# Patient Record
Sex: Female | Born: 1990 | Race: White | Hispanic: No | Marital: Single | Smoking: Never smoker
Health system: Southern US, Community
[De-identification: ages and names within clinical notes are randomized; demographics above are authoritative.]

## PROBLEM LIST (undated history)

## (undated) DIAGNOSIS — N83209 Unspecified ovarian cyst, unspecified side: Secondary | ICD-10-CM

## (undated) HISTORY — DX: Unspecified ovarian cyst, unspecified side: N83.209

---
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
## 2012-07-15 ENCOUNTER — Telehealth: Payer: Self-pay | Admitting: Certified Nurse Midwife

## 2012-07-15 NOTE — Telephone Encounter (Signed)
Spoke to patient she is complaining of a continuous bleed for 3 weeks the NuvaRing is not comfortable to her and is causing vaginal dryness. She wants to go on an OCP instead please advise.

## 2012-07-15 NOTE — Telephone Encounter (Signed)
See note

## 2012-07-15 NOTE — Telephone Encounter (Signed)
Patient notified of plan of action with NuvaRing.

## 2012-07-15 NOTE — Telephone Encounter (Signed)
She is in adjustment period should stop. Have her take out Nuvaring and finish week off and restart as usual .

## 2012-07-15 NOTE — Telephone Encounter (Signed)
Patient wants to speak with nurse re: switching birth control/[facility] [address] and [facility]

## 2012-08-19 ENCOUNTER — Other Ambulatory Visit: Payer: Self-pay

## 2012-08-19 ENCOUNTER — Telehealth: Payer: Self-pay | Admitting: Certified Nurse Midwife

## 2012-08-19 ENCOUNTER — Ambulatory Visit: Payer: Self-pay

## 2012-08-19 NOTE — Telephone Encounter (Signed)
Patient dnka gardasil appointment today. I left a message for patient to call a and reschedule.

## 2013-05-17 ENCOUNTER — Encounter: Payer: Self-pay | Admitting: Certified Nurse Midwife

## 2013-05-19 ENCOUNTER — Ambulatory Visit: Payer: Self-pay | Admitting: Certified Nurse Midwife

## 2013-05-25 ENCOUNTER — Encounter: Payer: Self-pay | Admitting: Certified Nurse Midwife

## 2013-05-25 ENCOUNTER — Telehealth: Payer: Self-pay | Admitting: Gynecology

## 2013-05-25 ENCOUNTER — Ambulatory Visit (INDEPENDENT_AMBULATORY_CARE_PROVIDER_SITE_OTHER): Payer: BC Managed Care – PPO | Admitting: Certified Nurse Midwife

## 2013-05-25 VITALS — BP 113/84 | HR 60 | Resp 16 | Ht 62.25 in | Wt 138.0 lb

## 2013-05-25 DIAGNOSIS — Z01419 Encounter for gynecological examination (general) (routine) without abnormal findings: Secondary | ICD-10-CM

## 2013-05-25 DIAGNOSIS — Z309 Encounter for contraceptive management, unspecified: Secondary | ICD-10-CM

## 2013-05-25 DIAGNOSIS — Z0000 Encounter for general adult medical examination without abnormal findings: Secondary | ICD-10-CM

## 2013-05-25 LAB — HEMOGLOBIN, FINGERSTICK: Hemoglobin, fingerstick: 13.4 g/dL (ref 12.0–16.0)

## 2013-05-25 MED ORDER — NORETHINDRONE ACET-ETHINYL EST 1-20 MG-MCG PO TABS: 1.0000 | ORAL_TABLET | Freq: Every day | ORAL | Status: DC

## 2013-05-25 NOTE — Telephone Encounter (Signed)
Note not needed 

## 2013-05-25 NOTE — Patient Instructions (Signed)

## 2013-05-25 NOTE — Progress Notes (Signed)
Reviewed personally.  M. Suzanne Darlis Wragg, MD.  

## 2013-05-25 NOTE — Progress Notes (Signed)
23 y.o. G0P0000 Single Caucasian Fe here for annual exam. Periods normal, occasional no period on, but consistent use. No health issues. No partner change or STD screening. Has new job as Production designer, theatre/television/film at [employer]  Patient's last menstrual period was 03/30/2013.          Sexually active: yes  The current method of family planning is OCP (estrogen/progesterone).    Exercising: yes  walking & running Smoker:  no  Health Maintenance: Pap:  2013 neg. MMG:  none Colonoscopy: none BMD:   none TDaP:  2011 Labs: Hgb-13.4 Self breast exam: not done   reports that she has never smoked. She does not have any smokeless tobacco history on file. She reports that she does not drink alcohol or use illicit drugs.  Past Medical History  Diagnosis Date  . Ovarian cyst     History reviewed. No pertinent past surgical history.  Current Outpatient Prescriptions  Medication Sig Dispense Refill  . MICROGESTIN 1-20 MG-MCG tablet daily.      . Multiple Vitamins-Minerals (MULTIVITAMIN PO) Take by mouth daily.       No current facility-administered medications for this visit.    Family History  Problem Relation Age of Onset  . Hypertension Maternal Grandfather     ROS:  Pertinent items are noted in HPI.  Otherwise, a comprehensive ROS was negative.  Exam:   BP 113/84  Pulse 60  Resp 16  Ht 5' 2.25" (1.581 m)  Wt 138 lb (62.596 kg)  BMI 25.04 kg/m2  LMP 03/30/2013 Height: 5' 2.25" (158.1 cm)  Ht Readings from Last 3 Encounters:  05/25/13 5' 2.25" (1.581 m)  02/12/09 5\' 4"  (1.626 m) (47%*, Z = -0.07)  10/23/08 5\' 4"  (1.626 m) (48%*, Z = -0.06)   * Growth percentiles are based on CDC 2-20 Years data.    General appearance: alert, cooperative and appears stated age Head: Normocephalic, without obvious abnormality, atraumatic Neck: no adenopathy, supple, symmetrical, trachea midline and thyroid normal to inspection and palpation and non-palpable Lungs: clear to auscultation bilaterally Breasts:  normal appearance, no masses or tenderness, No nipple retraction or dimpling, No nipple discharge or bleeding, No axillary or supraclavicular adenopathy Heart: regular rate and rhythm Abdomen: soft, non-tender; no masses,  no organomegaly Extremities: extremities normal, atraumatic, no cyanosis or edema Skin: Skin color, texture, turgor normal. No rashes or lesions Lymph nodes: Cervical, supraclavicular, and axillary nodes normal. No abnormal inguinal nodes palpated Neurologic: Grossly normal   Pelvic: External genitalia:  no lesions              Urethra:  normal appearing urethra with no masses, tenderness or lesions              Bartholin's and Skene's: normal                 Vagina: normal appearing vagina with normal color and discharge, no lesions              Cervix: normal,non tender              Pap taken: yes Bimanual Exam:  Uterus:  normal size, contour, position, consistency, mobility, non-tender and retroverted              Adnexa: normal adnexa and no mass, fullness, tenderness               Rectovaginal: Confirms               Anus:  normal sphincter tone, no  lesions  A:  Well Woman with normal exam  Contraception OCP desired  P:   Reviewed health and wellness pertinent to exam  Rx Microgestin see order  Pap smear as per guidelines   pap smear taken today with reflex  counseled on breast self exam, STD prevention, HIV risk factors and prevention, use and side effects of OCP's, adequate intake of calcium and vitamin D  return annually or prn  An After Visit Summary was printed and given to the patient.

## 2013-05-30 LAB — IPS PAP TEST WITH REFLEX TO HPV

## 2013-05-31 ENCOUNTER — Telehealth: Payer: Self-pay

## 2013-05-31 NOTE — Telephone Encounter (Signed)
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 In young women up to age 22 this may spontaneous resolve. It very important to be aware that HPV is present and a repeat pap smear needs to be done in one year for follow up to see if this has resolved. If she has not taken Gardasil or completed it is very important for her to do so. Patient can schedule OV if further questions. 08 ------

## 2013-05-31 NOTE — Telephone Encounter (Signed)
Patient notified of results. See lab 

## 2013-05-31 NOTE — Telephone Encounter (Signed)
lmtcb

## 2013-08-19 ENCOUNTER — Telehealth: Payer: Self-pay | Admitting: Certified Nurse Midwife

## 2013-08-19 DIAGNOSIS — Z309 Encounter for contraceptive management, unspecified: Secondary | ICD-10-CM

## 2013-08-19 MED ORDER — NORETHINDRONE ACET-ETHINYL EST 1-20 MG-MCG PO TABS: 1.0000 | ORAL_TABLET | Freq: Every day | ORAL | Status: DC

## 2013-08-19 NOTE — Telephone Encounter (Signed)
Spoke with patient. Patient states that she was seen in March by Verner Chol CNM. Was previously on birth control from [facility] and asked to continue with the same medication. After one month of taking patient state "My personality was changing and my hormones were ridiculous so I switched back to my old pack of birth control since I had a couple of packs left." Patient states that rx with [facility] was Microgestin 1-20 MG MCG. Advised patient that that is the same prescription we have on fill that we sent over at her [facility]. Patient states "They are not the same they are effecting me differently and my old package was white and now the one from your office is green." Patient's Rx from [facility] was called into [facility] off of [address] and [place]. Advised patient would contact pharmacy to verify rx and compare it to the rx we went over at [facility]. Advised if different would check with provider about the change and give patient a call back with further instructions and recommendations. Patient agreeable.

## 2013-08-19 NOTE — Telephone Encounter (Signed)
Spoke with [employer]. Patient has been taking Microgestin 1-20 21 without iron.

## 2013-08-19 NOTE — Telephone Encounter (Signed)
Pt has questions about her birth control.

## 2013-08-19 NOTE — Telephone Encounter (Signed)
Spoke with patient. Advised that active ingredients in her current prescription for Microgestin 1-20 mcg 21 day tablet have not been changed by Verner Chol CNM. She was ordered for same rx as she has been on prior. Advised that Pharmacies use different brands of pills and the generics may be substituted and she may be feeling side effects from the manufacturer of pill that [facility] is dispensing (called and confirmed with [facility] that they dispensed Norethindrone-ethinyl estradiol 1/20 mcg 21 tablets manufactured by [employer] which they state is A rated generic, states she last picked up her rx from [facility] 05/2013). Spoke with [facility] [phone] and confirmed they do have Microgestin and that they last dispensed this to her which was made by [employer]. I advised if she would like this brand only then she will need to transfer her rx back to the [place] and use that pharmacy. She is agreeable. Rx sent to [facility] per her request. She will call back if any side effects continue.   Routing to provider for final review. Patient agreeable to disposition. Will close encounter   cc Sara Chu.  Dr. Hyacinth Meeker covering.

## 2014-05-29 ENCOUNTER — Ambulatory Visit: Payer: BC Managed Care – PPO | Admitting: Certified Nurse Midwife

## 2014-05-29 ENCOUNTER — Encounter: Payer: Self-pay | Admitting: Certified Nurse Midwife

## 2014-05-29 ENCOUNTER — Telehealth: Payer: Self-pay | Admitting: Obstetrics & Gynecology

## 2014-05-29 NOTE — Telephone Encounter (Signed)
Patient dnka her aex appointment today with Sara Chuebbie Leonard.

## 2014-05-29 NOTE — Telephone Encounter (Signed)
[facility] and had abnormal pap smear and needs repeat this year

## 2014-06-05 NOTE — Telephone Encounter (Signed)
Left message for pt to call & schedule aex

## 2014-06-06 NOTE — Telephone Encounter (Signed)
Left message to callback to schedule aex 

## 2014-06-08 NOTE — Telephone Encounter (Signed)
Left message to callback to schedule aex 

## 2014-06-13 NOTE — Telephone Encounter (Signed)
Several attempts to contact patient with no callback or appt made. Pt needs a special pap letter mailed to her.

## 2014-06-14 NOTE — Telephone Encounter (Signed)
Do we have a special letter to send to a patient that needs repeat pap due to abnormal? This is an under 24 who needed repeat due to ASCUS Thank you

## 2014-06-21 ENCOUNTER — Encounter: Payer: Self-pay | Admitting: *Deleted

## 2014-06-21 NOTE — Telephone Encounter (Signed)
Pt has not returned call or scheduled [facility].  08 recall due in 05/2014.  05/25/13 Pap, ASCUS with pos HR HPV  Letter created.  Please advise recall.

## 2014-06-22 NOTE — Telephone Encounter (Signed)
Letter mailed and marked as sent.  Pt out of recall. Closing encounter. 

## 2014-06-22 NOTE — Telephone Encounter (Signed)
Letter signed.  Please mail.  Out of recall.   

## 2014-06-28 ENCOUNTER — Other Ambulatory Visit: Payer: Self-pay | Admitting: Certified Nurse Midwife

## 2014-06-28 DIAGNOSIS — Z3040 Encounter for surveillance of contraceptives, unspecified: Secondary | ICD-10-CM

## 2014-06-28 NOTE — Telephone Encounter (Signed)
Patient calling requesting refills on her birth control. Pharmacy on file is correct. [facility] 09/01/14.

## 2014-06-28 NOTE — Telephone Encounter (Signed)
Medication refill request: OCP Last AEX:  05/25/13 DL Next AEX: 1/61/096/10/16 Last MMG (if hormonal medication request): none Refill authorized: 08/19/13 #1pack/12R today #3packs/0R?  Routed to PG. DL Out of the office today

## 2014-06-30 MED ORDER — NORETHINDRONE ACET-ETHINYL EST 1-20 MG-MCG PO TABS: 1.0000 | ORAL_TABLET | Freq: Every day | ORAL | Status: DC

## 2014-09-01 ENCOUNTER — Encounter: Payer: Self-pay | Admitting: Certified Nurse Midwife

## 2014-09-01 ENCOUNTER — Ambulatory Visit (INDEPENDENT_AMBULATORY_CARE_PROVIDER_SITE_OTHER): Payer: BLUE CROSS/BLUE SHIELD | Admitting: Certified Nurse Midwife

## 2014-09-01 VITALS — BP 112/70 | HR 68 | Resp 16 | Ht 62.75 in | Wt 143.0 lb

## 2014-09-01 DIAGNOSIS — Z124 Encounter for screening for malignant neoplasm of cervix: Secondary | ICD-10-CM | POA: Diagnosis not present

## 2014-09-01 DIAGNOSIS — Z8742 Personal history of other diseases of the female genital tract: Secondary | ICD-10-CM

## 2014-09-01 DIAGNOSIS — Z30011 Encounter for initial prescription of contraceptive pills: Secondary | ICD-10-CM | POA: Diagnosis not present

## 2014-09-01 DIAGNOSIS — Z01419 Encounter for gynecological examination (general) (routine) without abnormal findings: Secondary | ICD-10-CM

## 2014-09-01 MED ORDER — NORETHIN ACE-ETH ESTRAD-FE 1-20 MG-MCG(24) PO TABS: 1.0000 | ORAL_TABLET | Freq: Every day | ORAL | Status: DC

## 2014-09-01 NOTE — Progress Notes (Signed)
24 y.o. G0P0000 Single  Caucasian Fe here for annual exam . Periods having spotting one week before onset of menses and then period lasts one week. Stopped OCP 4 months ago. Same partner for past 4 years, no STD concerns.  Patient would like to be back on contraception with OCP change. Questions regarding HPV related pap smear last year.  Sees urgent care if needed. No health issues today.  Patient's last menstrual period was 08/04/2014.          Sexually active: No  The current method of family planning is abstinence.    Exercising: Yes.    running Smoker:  no  Health Maintenance: Pap:  05-25-13 ASCUS HPV HR +, no colpo done due to age MMG:  none Colonoscopy:  none BMD:   none TDaP:  2011 Labs: none Self breast exam: aware of them but no official checks   reports that she has never smoked. She does not have any smokeless tobacco history on file. She reports that she does not drink alcohol or use illicit drugs.  Past Medical History  Diagnosis Date  . Ovarian cyst     History reviewed. No pertinent past surgical history.  Current Outpatient Prescriptions  Medication Sig Dispense Refill  . Multiple Vitamins-Minerals (MULTIVITAMIN PO) Take by mouth daily.     No current facility-administered medications for this visit.    Family History  Problem Relation Age of Onset  . Hypertension Maternal Grandfather     ROS:  Pertinent items are noted in HPI.  Otherwise, a comprehensive ROS was negative.  Exam:   BP 112/70 mmHg  Pulse 68  Resp 16  Ht 5' 2.75" (1.594 m)  Wt 143 lb (64.864 kg)  BMI 25.53 kg/m2  LMP 08/04/2014 Height: 5' 2.75" (159.4 cm) Ht Readings from Last 3 Encounters:  09/01/14 5' 2.75" (1.594 m)  05/25/13 5' 2.25" (1.581 m)  02/12/09  (1.626 m) (47 %*, Z = -0.08)   * Growth percentiles are based on CDC 2-20 Years data.    General appearance: alert, cooperative and appears stated age Head: Normocephalic, without obvious abnormality, atraumatic Neck: no  adenopathy, supple, symmetrical, trachea midline and thyroid normal to inspection and palpation Lungs: clear to auscultation bilaterally Breasts: normal appearance, no masses or tenderness, No nipple retraction or dimpling, No nipple discharge or bleeding, No axillary or supraclavicular adenopathy Heart: regular rate and rhythm Abdomen: soft, non-tender; no masses,  no organomegaly Extremities: extremities normal, atraumatic, no cyanosis or edema Skin: Skin color, texture, turgor normal. No rashes or lesions Lymph nodes: Cervical, supraclavicular, and axillary nodes normal. No abnormal inguinal nodes palpated Neurologic: Grossly normal   Pelvic: External genitalia:  no lesions              Urethra:  normal appearing urethra with no masses, tenderness or lesions              Bartholin's and Skene's: normal                 Vagina: normal appearing vagina with normal color and discharge, no lesions              Cervix: normal, non tender, no lesions              Pap taken: Yes.   scant blood noted Bimanual Exam:  Uterus:  normal size, contour, position, consistency, mobility, non-tender              Adnexa: normal adnexa and no mass,  fullness, tenderness               Rectovaginal: Confirms               Anus:  Normal appearance  Chaperone present: Yes  A:  Well Woman with normal exam  Contraception OCP desired for contraception and cycle control  History of ASCUS pap with HPVHR positive follow up pap today    P:   Reviewed health and wellness pertinent to exam  Discussed keeping menses calendar and if spotting continues after 3 months of OCP(adjustment period) may need evaluation for or change OCP. Patient agreeable and will advise.  Rx Loestrin 24 FE see order with instructions  Discussed HPV and abnormal pap smear relationship. Discussed sexual transmission and Gardasil protection relationship in protection against HPVHR. Questions addressed in length. Given information sheet on  also. Will repeat pap smear if same results will still monitor and repeat. If not will advise. Patient voiced understanding.  Pap smear with HPVHR   counseled on breast self exam, STD prevention, HIV risk factors and prevention, diet, exercise, calcium.  return annually or prn  10 minutes in face to face consult regarding HPV etiology and pap smear.

## 2014-09-01 NOTE — Patient Instructions (Signed)
General topics  Next pap or exam is  due in 1 year Take a Women's multivitamin Take 1200 mg. of calcium daily - prefer dietary If any concerns in interim to call back  Breast Self-Awareness Practicing breast self-awareness may pick up problems early, prevent significant medical complications, and possibly save your life. By practicing breast self-awareness, you can become familiar with how your breasts look and feel and if your breasts are changing. This allows you to notice changes early. It can also offer you some reassurance that your breast health is good. One way to learn what is normal for your breasts and whether your breasts are changing is to do a breast self-exam. If you find a lump or something that was not present in the past, it is best to contact your caregiver right away. Other findings that should be evaluated by your caregiver include nipple discharge, especially if it is bloody; skin changes or reddening; areas where the skin seems to be pulled in (retracted); or new lumps and bumps. Breast pain is seldom associated with cancer (malignancy), but should also be evaluated by a caregiver. BREAST SELF-EXAM The best time to examine your breasts is 5 7 days after your menstrual period is over.  ExitCare Patient Information 2013 ExitCare[employer].   Exercise to Stay Healthy Exercise helps you become and stay healthy. EXERCISE IDEAS AND TIPS Choose exercises that:  You enjoy.  Fit into your day. You do not need to exercise really hard to be healthy. You can do exercises at a slow or medium level and stay healthy. You can:  Stretch before and after working out.  Try yoga, Pilates, or tai chi.  Lift weights.  Walk fast, swim, jog, run, climb stairs, bicycle, dance, or rollerskate.  Take aerobic classes. Exercises that burn about 150 calories:  Running 1  miles in 15 minutes.  Playing volleyball for 45 to 60 minutes.  Washing and waxing a car for 45 to 60  minutes.  Playing touch football for 45 minutes.  Walking 1  miles in 35 minutes.  Pushing a stroller 1  miles in 30 minutes.  Playing basketball for 30 minutes.  Raking leaves for 30 minutes.  Bicycling 5 miles in 30 minutes.  Walking 2 miles in 30 minutes.  Dancing for 30 minutes.  Shoveling snow for 15 minutes.  Swimming laps for 20 minutes.  Walking up stairs for 15 minutes.  Bicycling 4 miles in 15 minutes.  Gardening for 30 to 45 minutes.  Jumping rope for 15 minutes.  Washing windows or floors for 45 to 60 minutes. Document Released: 04/12/2010 Document Revised: 06/02/2011 Document Reviewed: 04/12/2010 ExitCare Patient Information 2013 ExitCare[employer].   Other topics ( that may be useful information):    Sexually Transmitted Disease Sexually transmitted disease (STD) refers to any infection that is passed from person to person during sexual activity. This may happen by way of saliva, semen, blood, vaginal mucus, or urine. Common STDs include:  Gonorrhea.  Chlamydia.  Syphilis.  HIV/AIDS.  Genital herpes.  Hepatitis B and C.  Trichomonas.  Human papillomavirus (HPV).  Pubic lice. CAUSES  An STD may be spread by bacteria, virus, or parasite. A person can get an STD by:  Sexual intercourse with an infected person.  Sharing sex toys with an infected person.  Sharing needles with an infected person.  Having intimate contact with the genitals, mouth, or rectal areas of an infected person. SYMPTOMS  Some people may not have any symptoms, but   they can still pass the infection to others. Different STDs have different symptoms. Symptoms include:  Painful or bloody urination.  Pain in the pelvis, abdomen, vagina, anus, throat, or eyes.  Skin rash, itching, irritation, growths, or sores (lesions). These usually occur in the genital or anal area.  Abnormal vaginal discharge.  Penile discharge in men.  Soft, flesh-colored skin growths in the  genital or anal area.  Fever.  Pain or bleeding during sexual intercourse.  Swollen glands in the groin area.  Yellow skin and eyes (jaundice). This is seen with hepatitis. DIAGNOSIS  To make a diagnosis, your caregiver may:  Take a medical history.  Perform a physical exam.  Take a specimen (culture) to be examined.  Examine a sample of discharge under a microscope.  Perform blood test TREATMENT   Chlamydia, gonorrhea, trichomonas, and syphilis can be cured with antibiotic medicine.  Genital herpes, hepatitis, and HIV can be treated, but not cured, with prescribed medicines. The medicines will lessen the symptoms.  Genital warts from HPV can be treated with medicine or by freezing, burning (electrocautery), or surgery. Warts may come back.  HPV is a virus and cannot be cured with medicine or surgery.However, abnormal areas may be followed very closely by your caregiver and may be removed from the cervix, vagina, or vulva through office procedures or surgery. If your diagnosis is confirmed, your recent sexual partners need treatment. This is true even if they are symptom-free or have a negative culture or evaluation. They should not have sex until their caregiver says it is okay. HOME CARE INSTRUCTIONS  All sexual partners should be informed, tested, and treated for all STDs.  Take your antibiotics as directed. Finish them even if you start to feel better.  Only take over-the-counter or prescription medicines for pain, discomfort, or fever as directed by your caregiver.  Rest.  Eat a balanced diet and drink enough fluids to keep your urine clear or pale yellow.  Do not have sex until treatment is completed and you have followed up with your caregiver. STDs should be checked after treatment.  Keep all follow-up appointments, Pap tests, and blood tests as directed by your caregiver.  Only use latex condoms and water-soluble lubricants during sexual activity. Do not use  petroleum jelly or oils.  Avoid alcohol and illegal drugs.  Get vaccinated for HPV and hepatitis. If you have not received these vaccines in the past, talk to your caregiver about whether one or both might be right for you.  Avoid risky sex practices that can break the skin. The only way to avoid getting an STD is to avoid all sexual activity.Latex condoms and dental dams (for oral sex) will help lessen the risk of getting an STD, but will not completely eliminate the risk. SEEK MEDICAL CARE IF:   You have a fever.  You have any new or worsening symptoms. Document Released: 05/31/2002 Document Revised: 06/02/2011 Document Reviewed: 06/07/2010 [facility] Patient Information 2013 [place].    Domestic Abuse You are being battered or abused if someone close to you hits, pushes, or physically hurts you in any way. You also are being abused if you are forced into activities. You are being sexually abused if you are forced to have sexual contact of any kind. You are being emotionally abused if you are made to feel worthless or if you are constantly threatened. It is important to remember that help is available. No one has the right to abuse you. PREVENTION OF FURTHER  ABUSE  Learn the warning signs of danger. This varies with situations but may include: the use of alcohol, threats, isolation from friends and family, or forced sexual contact. Leave if you feel that violence is going to occur.  If you are attacked or beaten, report it to the police so the abuse is documented. You do not have to press charges. The police can protect you while you or the attackers are leaving. Get the officer's name and badge number and a copy of the report.  Find someone you can trust and tell them what is happening to you: your caregiver, a nurse, clergy member, close friend or family member. Feeling ashamed is natural, but remember that you have done nothing wrong. No one deserves abuse. Document Released:  03/07/2000 Document Revised: 06/02/2011 Document Reviewed: 05/16/2010 ExitCare Patient Information 2013 [employer]Care[employer].    How Much is Too Much Alcohol? Drinking too much alcohol can cause injury, accidents, and health problems. These types of problems can include:   Car crashes.  Falls.  Family fighting (domestic violence).  Drowning.  Fights.  Injuries.  Burns.  Damage to certain organs.  Having a baby with birth defects. ONE DRINK CAN BE TOO MUCH WHEN YOU ARE:  Working.  Pregnant or breastfeeding.  Taking medicines. Ask your doctor.  Driving or planning to drive. If you or someone you know has a drinking problem, get help from a doctor.  Document Released: 01/04/2009 Document Revised: 06/02/2011 Document Reviewed: 01/04/2009 ExitCare Patient Information 2013 ExitCare[employer].   Smoking Hazards Smoking cigarettes is extremely bad for your health. Tobacco smoke has over 200 known poisons in it. There are over 60 chemicals in tobacco smoke that cause cancer. Some of the chemicals found in cigarette smoke include:   Cyanide.  Benzene.  Formaldehyde.  Methanol (wood alcohol).  Acetylene (fuel used in welding torches).  Ammonia. Cigarette smoke also contains the poisonous gases nitrogen oxide and carbon monoxide.  Cigarette smokers have an increased risk of many serious medical problems and Smoking causes approximately:  90% of all lung cancer deaths in men.  80% of all lung cancer deaths in women.  90% of deaths from chronic obstructive lung disease. Compared with nonsmokers, smoking increases the risk of:  Coronary heart disease by 2 to 4 times.  Stroke by 2 to 4 times.  Men developing lung cancer by 23 times.  Women developing lung cancer by 13 times.  Dying from chronic obstructive lung diseases by 12 times.  . Smoking is the most preventable cause of death and disease in our society.  WHY IS SMOKING ADDICTIVE?  Nicotine is the chemical  agent in tobacco that is capable of causing addiction or dependence.  When you smoke and inhale, nicotine is absorbed rapidly into the bloodstream through your lungs. Nicotine absorbed through the lungs is capable of creating a powerful addiction. Both inhaled and non-inhaled nicotine may be addictive.  Addiction studies of cigarettes and spit tobacco show that addiction to nicotine occurs mainly during the teen years, when young people begin using tobacco products. WHAT ARE THE BENEFITS OF QUITTING?  There are many health benefits to quitting smoking.   Likelihood of developing cancer and heart disease decreases. Health improvements are seen almost immediately.  Blood pressure, pulse rate, and breathing patterns start returning to normal soon after quitting. QUITTING SMOKING   American Lung Association - 1-800-LUNGUSA  American Cancer Society - 1-800-ACS-2345 Document Released: 04/17/2004 Document Revised: 06/02/2011 Document Reviewed: 12/20/2008 ExitCare Patient Information 2013 ExitCare,   LLC.   Stress Management Stress is a state of physical or mental tension that often results from changes in your life or normal routine. Some common causes of stress are:  Death of a loved one.  Injuries or severe illnesses.  Getting fired or changing jobs.  Moving into a new home. Other causes may be:  Sexual problems.  Business or financial losses.  Taking on a large debt.  Regular conflict with someone at home or at work.  Constant tiredness from lack of sleep. It is not just bad things that are stressful. It may be stressful to:  Win the lottery.  Get married.  Buy a new car. The amount of stress that can be easily tolerated varies from person to person. Changes generally cause stress, regardless of the types of change. Too much stress can affect your health. It may lead to physical or emotional problems. Too little stress (boredom) may also become stressful. SUGGESTIONS TO  REDUCE STRESS:  Talk things over with your family and friends. It often is helpful to share your concerns and worries. If you feel your problem is serious, you may want to get help from a professional counselor.  Consider your problems one at a time instead of lumping them all together. Trying to take care of everything at once may seem impossible. List all the things you need to do and then start with the most important one. Set a goal to accomplish 2 or 3 things each day. If you expect to do too many in a single day you will naturally fail, causing you to feel even more stressed.  Do not use alcohol or drugs to relieve stress. Although you may feel better for a short time, they do not remove the problems that caused the stress. They can also be habit forming.  Exercise regularly - at least 3 times per week. Physical exercise can help to relieve that "uptight" feeling and will relax you.  The shortest distance between despair and hope is often a good night's sleep.  Go to bed and get up on time allowing yourself time for appointments without being rushed.  Take a short "time-out" period from any stressful situation that occurs during the day. Close your eyes and take some deep breaths. Starting with the muscles in your face, tense them, hold it for a few seconds, then relax. Repeat this with the muscles in your neck, shoulders, hand, stomach, back and legs.  Take good care of yourself. Eat a balanced diet and get plenty of rest.  Schedule time for having fun. Take a break from your daily routine to relax. HOME CARE INSTRUCTIONS   Call if you feel overwhelmed by your problems and feel you can no longer manage them on your own.  Return immediately if you feel like hurting yourself or someone else. Document Released: 09/03/2000 Document Revised: 06/02/2011 Document Reviewed: 04/26/2007 [facility] Patient Information 2013 [place].  Human Papillomavirus HPV stands for human  papillomavirus. There are many different types of HPV. People of all ages and races can get an HPV infection. In females, some types of HPV can cause warts on the sex organs or anus. Some females never see any warts on the outside, yet still have the infection inside. The doctor will need to check the sex organs and do a Pap test to see there is an HPV infection. The only sign of infection may be a Pap test that is abnormal. A female who has HPV has a higher  chance of getting cancer of the sex organs or anus. It is very rare, but some females can give their babies the HPV infection when the baby is being born. Some of these babies can grow warts on the voice box (vocal cords). Males with HPV have a higher chance of getting cancer of the penis or anus. A female may have HPV but not see any warts on his penis or anus. There is no test to find HPV in males, so even if warts are not seen, there may still be an infection. HOME CARE   Take medicines as told by your doctor.  Get needed Pap tests.  Keep follow-up exams.  Do not touch or scratch the warts.  Do not treat warts with medicines used for treating hand warts.  Tell your sex partner about your infection because he or she may also need treatment.  Do not have sex while you are getting treatment.  After treatment, use condoms during sex.  Use over-the-counter creams for itching as told by your doctor.  Use over-the-counter or prescription medicines for pain, discomfort or fever as told by your doctor.  Do not douche or use tampons during treatment of HPV. GET HELP RIGHT AWAY IF:  You have a temperature by mouth above 102 F (38.9 C), not controlled by medicine. MAKE SURE YOU:   Understand these instructions.  Will watch your condition.  Will get help right away if you are not doing well or get worse. Document Released: 02/21/2008 Document Revised: 06/02/2011 Document Reviewed: 06/15/2013 [facility] Patient Information 2015 [place],  [place]. This information is not intended to replace advice given to you by your health care provider. Make sure you discuss any questions you have with your health care provider.

## 2014-09-03 NOTE — Progress Notes (Signed)
Reviewed personally.  M. Suzanne Rea Kalama, MD.  

## 2014-09-05 LAB — IPS PAP TEST WITH HPV

## 2014-09-07 ENCOUNTER — Other Ambulatory Visit: Payer: Self-pay | Admitting: Certified Nurse Midwife

## 2014-09-07 DIAGNOSIS — R87619 Unspecified abnormal cytological findings in specimens from cervix uteri: Secondary | ICD-10-CM

## 2014-09-08 ENCOUNTER — Telehealth: Payer: Self-pay | Admitting: Emergency Medicine

## 2014-09-08 NOTE — Telephone Encounter (Signed)
Message left to return call to [place] at [phone].   Patient recently restarted ocp. LMP 08/04/14 per annual exam note.

## 2014-09-08 NOTE — Telephone Encounter (Signed)
Patient notified of message from Verner Chol CNM.  Patient given results. Patient tone escalated and patient expresses frustration with this result and recommendations for additional follow up. Patient asks questions about HPV and  and its relation to her receiving the HPV vaccine. Attempted to answer each question but patient interrupted with each response with further questions and did not allow explanation of results. Patient states "not that you care, anyways!"   Advised patient that consult with Dr. Hyacinth Meeker will be best as she can answer questions for patient prior to procedure and if patient chooses not to proceed at this that time, it is her choice. Patient agreed to schedule consult appointment with possible procedure.   Consult/colposcopy scheduled. LMP 09/01/14 and started Loestrin 24 FE with cycle.   Brief description of procedure given to patient and instructions given. Advised 800 mg of Motrin with food one hour prior to appointment , however, patient declines further information and interrupted with attempts to give instructions.      Advised will need to cancel or reschedule within 72 hours or will have $150.00 no show fee placed to account.   Patient verbalized understanding of preprocedure instructions and cancellation policy.  Patient is advised she will be contacted with insurance coverage information.           Cc Lilyan Gilford for insurance pre certification.   Routing to Dr. Hyacinth Meeker and Verner Chol CNM.   Will close encounter.

## 2014-09-08 NOTE — Telephone Encounter (Signed)
-----   Message from Verner Chol, CNM sent at 09/07/2014  7:29 AM EDT ----- Notify patient that her pap smear is abnormal with ASCUS + HPVHR and Atypical glandular cells . Needs colposcopy exam even though less than 23 years old due to atypical glandular cells. Order in  Schedule with Dr. Hyacinth Meeker

## 2014-09-11 ENCOUNTER — Telehealth: Payer: Self-pay | Admitting: Obstetrics & Gynecology

## 2014-09-11 NOTE — Telephone Encounter (Signed)
Called patient and she is agreeable to move appointment up to 1500. Appointment changed. Routing to provider for final review. Patient agreeable to disposition. Will close encounter.

## 2014-09-11 NOTE — Telephone Encounter (Signed)
Contacted pt with benefit for colposcopy scheduled for 09/12/14. Pt agreeable. Pt would like to check if there are any earlier appointments tomorrow. Pt requests call only if there is another option. If no other appointments, pt is ok with current time.

## 2014-09-12 ENCOUNTER — Ambulatory Visit: Payer: BLUE CROSS/BLUE SHIELD | Admitting: Obstetrics & Gynecology

## 2014-09-12 ENCOUNTER — Ambulatory Visit (INDEPENDENT_AMBULATORY_CARE_PROVIDER_SITE_OTHER): Payer: BLUE CROSS/BLUE SHIELD | Admitting: Obstetrics & Gynecology

## 2014-09-12 VITALS — BP 112/62 | HR 64 | Resp 12 | Wt 142.0 lb

## 2014-09-12 DIAGNOSIS — R896 Abnormal cytological findings in specimens from other organs, systems and tissues: Secondary | ICD-10-CM | POA: Diagnosis not present

## 2014-09-12 DIAGNOSIS — R87619 Unspecified abnormal cytological findings in specimens from cervix uteri: Secondary | ICD-10-CM

## 2014-09-12 DIAGNOSIS — N926 Irregular menstruation, unspecified: Secondary | ICD-10-CM

## 2014-09-12 DIAGNOSIS — R87610 Atypical squamous cells of undetermined significance on cytologic smear of cervix (ASC-US): Secondary | ICD-10-CM | POA: Diagnosis not present

## 2014-09-12 DIAGNOSIS — IMO0002 Reserved for concepts with insufficient information to code with codable children: Secondary | ICD-10-CM

## 2014-09-12 LAB — POCT URINE PREGNANCY: Preg Test, Ur: NEGATIVE

## 2014-09-12 NOTE — Progress Notes (Signed)
24 y.o. G0 Single Caucasian female here for colposcopy with possible biopsies and/or ECC due to ASUCS and AGUS pap with +HR HPV Pap obtained 09/01/14 with AEX with Leota Sauers.  Pt has many, many questions which were addressed individually.  She felt much better with explanation before proceeding with colposcopy.    Also discussed with pt issues/concerns she has with contraception.  Reports she was on Depo provera for most of 2013.  Just bled all the time saw switched to Nuva ring for a few months but this wasn't a great method for her as the ring often fell out.  Pt was then on Loestrin 24 which worked well for her.  She stopped the OCPs in December and now cycles have been every two weeks.  Reports she is bleeding now about every two weeks.  Flow typically seven days whether on or off OCPs.  Just restarted Loloestrin 24.  Started it on June 10th.    Patient's last menstrual period was 08/28/2014 (approximate).          Sexually active: No.  The current method of family planning is abstinence.     Patient has been counseled about results and procedure.  Risks and benefits have bene reviewed including immediate and/or delayed bleeding, infection, cervical scaring from procedure, possibility of needing additional follow up as well as treatment.  rare risks of missing a lesion discussed as well.  All questions answered.  Pt ready to proceed.  BP 112/62 mmHg  Pulse 64  Resp 12  Wt 142 lb (64.411 kg)  LMP 08/28/2014 (Approximate)  General appearance: alert, cooperative and appears stated age Head: Normocephalic, without obvious abnormality, atraumatic Neurologic: Grossly normal  Pelvic: External genitalia:  no lesions              Urethra:  normal appearing urethra with no masses, tenderness or lesions              Bartholins and Skenes: normal                 Vagina: normal appearing vagina with normal color and discharge, no lesions              Cervix: no lesions              Pap taken:  No.  Speculum placed.  3% acetic acid applied to cervix for >45 seconds.  Cervix visualized with both 7.5X and 15X magnification.  Green filter also used.  Lugols solution was used.  Findings:  Small area of AWE and decreased staining with Lugol's at 10-11 o'clock.  Biopsy:  At same location was obtained.  ECC:  was performed.  Monsel's was needed.  Excellent hemostasis was present.  Pt tolerated procedure well and all instruments were removed.  Findings noted above on picture of cervix.  Assessment:  ASCUS pap with +HR HPV, AGUS pap DUB off OCPs, recently restarted Non-smoker  Plan:  Pathology results will be called to patient and follow-up planned pending results.  Will plan repeat Pap in 6 months if everything appears appropriate with pathology results.  Advised to let us know next month if she is still bleeding irregularly for possible change in OCP vs  IUD use.  Information provided.

## 2014-09-15 LAB — IPS OTHER TISSUE BIOPSY

## 2014-09-17 ENCOUNTER — Encounter: Payer: Self-pay | Admitting: Obstetrics & Gynecology

## 2014-09-19 ENCOUNTER — Telehealth: Payer: Self-pay | Admitting: Emergency Medicine

## 2014-09-19 DIAGNOSIS — Z30430 Encounter for insertion of intrauterine contraceptive device: Secondary | ICD-10-CM

## 2014-09-19 DIAGNOSIS — N938 Other specified abnormal uterine and vaginal bleeding: Secondary | ICD-10-CM

## 2014-09-19 NOTE — Telephone Encounter (Signed)
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  ECC showed same finding and negative glandular tissue.  H/O ASCUS with +HR HPV and AGUS pap.  Needs repeat pap 6 months with me.  08 recall.

## 2014-09-19 NOTE — Telephone Encounter (Signed)
Patient states she spoke with Dr. Hyacinth MeekerMiller and discussed Mirena and to plan for placement under ultrasound guidance.  Patient states she is bleeding "most of the time." Pt unsure about dates of cycle. Wants to move forward with Mirena placement.     Can you advise okay to place at any time? Okay to place orders for precert? Currently on Loestrin 24 FE.     Spoke with patient and message from Dr. Hyacinth MeekerMiller given. Patient verbalized understanding of results and scheduled 6 Month Pap smear with Dr. Hyacinth MeekerMiller.   Advised would return call with plan regarding Mirena from Dr. Hyacinth MeekerMiller. Patient agreeable.  06 recall for pap in 6 months and 08 recall placed.

## 2014-09-20 NOTE — Telephone Encounter (Signed)
Orders signed.  Pt needs to abstain for two weeks and will need UPT day of visit.  As long as negative at that time, will proceed with placement.  Please schedule IUD insertion with PUS in two weeks from tomorrow.  Thanks.

## 2014-09-20 NOTE — Telephone Encounter (Signed)
Message left to return call to [place] at [phone].   Cytotec order pended

## 2014-09-28 MED ORDER — MISOPROSTOL 200 MCG PO TABS: ORAL_TABLET | ORAL | Status: DC

## 2014-09-28 NOTE — Telephone Encounter (Addendum)
Call to patient again. She is given message from Dr. Hyacinth MeekerMiller. She is agreeable to abstain for two weeks and plan for Mirena IUD under ultrasound guidance.   Pre procedure instructions given.  Motrin instructions given. Motrin=Advil=Ibuprofen, 800 mg one hour before appointment. Eat a meal and hydrate well before appointment. Take Cytotec 200 mcg tablet.  1 tablet PV night before the procedure. 1 tablet PV the morning of the procedure.   Patient scheduled for two weeks from today, advised to abstain for two weeks then procedures planned for 10/12/14. Patient verbalized understanding. She still is taking Loestin 24 FE.   Patient had urine pregnancy test 09/12/14 negative.   Dr. Hyacinth MeekerMiller, okay as scheduled?   cc Lilyan GilfordBecky Frahm for procedure/Pelvic ultrasound pre-certification.

## 2014-09-28 NOTE — Telephone Encounter (Signed)
Yes.  Ok as scheduled.

## 2014-10-02 ENCOUNTER — Telehealth: Payer: Self-pay | Admitting: Obstetrics and Gynecology

## 2014-10-02 NOTE — Telephone Encounter (Signed)
Placed call to patient to review benefits and schedule SHGM. No answer. Unable to leave message "mailbox full".

## 2014-10-02 NOTE — Telephone Encounter (Signed)
error 

## 2014-10-03 NOTE — Telephone Encounter (Signed)
Routing to provider for sign off. Encounter closed.

## 2014-10-05 ENCOUNTER — Telehealth: Payer: Self-pay | Admitting: Obstetrics & Gynecology

## 2014-10-05 NOTE — Telephone Encounter (Signed)
Called patient to review benefits for procedure. Left voicemail to call back and review. °

## 2014-10-06 NOTE — Telephone Encounter (Signed)
Pt returned call. Reviewed benefits for iud insertion under ultrasound guidance. Pt agreeable. Verified arrival time for appt. Ok to close

## 2014-10-11 ENCOUNTER — Telehealth: Payer: Self-pay | Admitting: Obstetrics & Gynecology

## 2014-10-11 NOTE — Telephone Encounter (Signed)
Provider cx. Left voicemail for patient to call back and reschedule.  Appointment is for ultrasound guided iud insertion. Ok to offer appointment Tuesday or Thursday next week.

## 2014-10-12 ENCOUNTER — Other Ambulatory Visit: Payer: BLUE CROSS/BLUE SHIELD | Admitting: Obstetrics & Gynecology

## 2014-10-12 ENCOUNTER — Other Ambulatory Visit: Payer: BLUE CROSS/BLUE SHIELD

## 2014-10-12 NOTE — Telephone Encounter (Signed)
Appointment scheduled for 10-17-14. Encounter closed.

## 2014-10-12 NOTE — Telephone Encounter (Signed)
Returning call.

## 2014-10-17 ENCOUNTER — Ambulatory Visit (INDEPENDENT_AMBULATORY_CARE_PROVIDER_SITE_OTHER): Payer: BLUE CROSS/BLUE SHIELD

## 2014-10-17 ENCOUNTER — Ambulatory Visit (INDEPENDENT_AMBULATORY_CARE_PROVIDER_SITE_OTHER): Payer: BLUE CROSS/BLUE SHIELD | Admitting: Obstetrics & Gynecology

## 2014-10-17 ENCOUNTER — Other Ambulatory Visit: Payer: Self-pay | Admitting: Obstetrics & Gynecology

## 2014-10-17 VITALS — BP 114/64 | Wt 144.4 lb

## 2014-10-17 DIAGNOSIS — N938 Other specified abnormal uterine and vaginal bleeding: Secondary | ICD-10-CM | POA: Diagnosis not present

## 2014-10-17 DIAGNOSIS — Z30430 Encounter for insertion of intrauterine contraceptive device: Secondary | ICD-10-CM

## 2014-10-17 DIAGNOSIS — Q519 Congenital malformation of uterus and cervix, unspecified: Secondary | ICD-10-CM

## 2014-10-17 NOTE — Progress Notes (Signed)
24 y.o. G0P0000 Single Caucasian female presents for  insertion of IUD.  Pt would prefer a Mirena IUD but is aware she may not be able to have this placed if uterine cavity is too small.  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  Recommended pt having IUD placed under ultrasound guidance.    She has been counseled about alternative forms of birth control including oral contraceptives, progesterone methods, IUD, barrier method, and sterilization.  She has decided to proceed with IUD placement.  Currently, she denies any vaginal symptoms or STD concerns.  LMP: spotting since starting OCP.  Has abstained >2 weeks and negative UPT today.  Gen:  WNWF healthy female NAD Abdomen: soft, non-tender  Pelvic exam: Vulva:  normal female genitalia Vagina:  normal vagina Cervix:  Non-tender, Negative CMT, no lesions or redness Uterus:  normal shape, retroverted   After patient read information booklet and all questions were answered, informed consent was obtained.      Procedure:  Speculum inserted into vagina. Cervix visualized and cleansed with betadine solution X 3. Paracervical block placed:  yes, 1% Lidocaine, plain.  6 cc total used.  Lot [id number], exp 03/25/2015.  Tenaculum placed on cervix at 12 o'clock position.  Uterus sounded to 7 centimeters.  Mirena IUD and inserting device removed from sterile packet and under sterile conditions inserted to fundus of uterus.  IUD released and introducer removed without difficulty.  IUD string trimmed to 2 centimeters.  Remainder string given to patient to feel for identification.  Tenaculum removed.  Minimal bleeding noted.  Speculum removed.  Uterus palpated normal.  Patient tolerated procedure well.  IUD Lot #:[id number].  Exp: 12/18.  Package information attached to consent and scanned into EPIC.  A: Insertion of Mirena IUD  P: Return to office 4-6 weeks for recheck      Pt knows IUD needs to be replaced approximately 5 years, no  later than 10/17/2019.  Instructions provided.

## 2014-10-29 ENCOUNTER — Encounter: Payer: Self-pay | Admitting: Obstetrics & Gynecology

## 2014-12-08 ENCOUNTER — Encounter: Payer: Self-pay | Admitting: Obstetrics & Gynecology

## 2014-12-08 ENCOUNTER — Ambulatory Visit (INDEPENDENT_AMBULATORY_CARE_PROVIDER_SITE_OTHER): Payer: BLUE CROSS/BLUE SHIELD | Admitting: Obstetrics & Gynecology

## 2014-12-08 VITALS — BP 120/68 | HR 68 | Resp 16 | Wt 147.0 lb

## 2014-12-08 DIAGNOSIS — Z30431 Encounter for routine checking of intrauterine contraceptive device: Secondary | ICD-10-CM

## 2014-12-08 NOTE — Progress Notes (Signed)
Subjective:     Patient ID: Brooke Hawkins, female   DOB: [birth year], 24 y.o.   MRN: [id number]  HPI 24 yo G0 SWF here for follow-up after Mirena IUD placement.  Pt reports having a lot of spotting after IUD placement.  She does have increased cramping due to intensity of her cramping.  This is only with her bleeding.  The bleeding is lighter and she isn't having clotting like before the IUD.  No pain with intercourse.  Significant other cannot feel the string.    Has been with partner about four years.    Reports some increased fatigue since IUD placement.  Wonders if related.  Advised unlikely.  Blood work recommended.  Pt would like to monitor and will consider at January appointment.  Pt with hx of ADHD and off all medications.   Review of Systems  Constitutional: Positive for fatigue.  Genitourinary: Positive for vaginal bleeding.       Objective:   Physical Exam  Constitutional: She is oriented to person, place, and time. She appears well-developed and well-nourished.  Genitourinary: Vagina normal and uterus normal. There is no rash, tenderness, lesion or injury on the right labia. There is no rash, tenderness, lesion or injury on the left labia. Cervix exhibits no motion tenderness and no discharge. Right adnexum displays no mass, no tenderness and no fullness. Left adnexum displays no mass, no tenderness and no fullness.  2.5cm IUD string noted at cervical os.  Lymphadenopathy:       Right: No inguinal adenopathy present.       Left: No inguinal adenopathy present.  Neurological: She is alert and oriented to person, place, and time.  Skin: Skin is warm.  Psychiatric: She has a normal mood and affect.       Assessment:     IUD recheck, doing well, with normal exam Increased cramping but only with bleeding  Increased AM fatigue H/O ADHD    Plan:     Return for repeat Pap in January Pt will monitor and call with worsening symptoms

## 2015-03-27 ENCOUNTER — Ambulatory Visit: Payer: BLUE CROSS/BLUE SHIELD | Admitting: Obstetrics & Gynecology

## 2015-03-27 ENCOUNTER — Encounter: Payer: Self-pay | Admitting: Obstetrics & Gynecology

## 2015-03-27 ENCOUNTER — Telehealth: Payer: Self-pay | Admitting: Obstetrics & Gynecology

## 2015-03-27 NOTE — Telephone Encounter (Signed)
Patient did not keep her appointment today with Dr. Hyacinth MeekerMiller for a six month Pap. I called the patient and left a message to call back to reschedule her missed appointment.

## 2015-04-08 NOTE — Telephone Encounter (Signed)
Pt has not returned phone calls for follow up pap appt.  H/O AGUS pap.  Can you call her?  If not response, I will need to send certified letter.  Thanks.

## 2015-04-09 NOTE — Telephone Encounter (Signed)
Left message to call Kaitlyn at [phone]. 

## 2015-04-11 NOTE — Telephone Encounter (Signed)
Left message to call Veena Sturgess at [phone]. 

## 2015-04-13 ENCOUNTER — Encounter: Payer: Self-pay | Admitting: Obstetrics & Gynecology

## 2015-04-13 NOTE — Telephone Encounter (Signed)
30 Day letter written.  This needs to be sent and then Kennon Rounds can hold.  Thanks.

## 2015-04-13 NOTE — Telephone Encounter (Signed)
30 day letter to Sharma Covert to send certified mail. She will then give the letter to Billie Ruddy, RN to hold.

## 2015-04-13 NOTE — Telephone Encounter (Signed)
Dr.Miller, I have attempted to reach this patient x2 with no return call.

## 2015-04-27 ENCOUNTER — Encounter: Payer: Self-pay | Admitting: Obstetrics & Gynecology

## 2015-04-27 ENCOUNTER — Ambulatory Visit: Payer: BLUE CROSS/BLUE SHIELD | Admitting: Obstetrics & Gynecology

## 2015-04-27 ENCOUNTER — Telehealth: Payer: Self-pay | Admitting: Obstetrics & Gynecology

## 2015-04-27 NOTE — Telephone Encounter (Signed)
Patient did not keep her appointment today. She called and cancelled her six month Pap appointment for this afternoon with Dr. Hyacinth Meeker due to "cannot get out of work." Patient said, "I'll have to call back and reschedule when I get my new schedule."

## 2015-04-30 NOTE — Telephone Encounter (Signed)
30 day letter for returning for Pap smear was sent in late January.  Pt is aware of importance of Pap smear.  Ok to close encounter.

## 2015-08-23 ENCOUNTER — Encounter: Payer: Self-pay | Admitting: Obstetrics & Gynecology
